# Patient Record
Sex: Male | Born: 2005 | Race: Asian | Hispanic: No | Marital: Single | Smoking: Never smoker
Health system: Southern US, Community
[De-identification: ages and names within clinical notes are randomized; demographics above are authoritative.]

## PROBLEM LIST (undated history)

## (undated) DIAGNOSIS — J45909 Unspecified asthma, uncomplicated: Secondary | ICD-10-CM

---
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
## 2025-05-08 ENCOUNTER — Encounter: Payer: Self-pay | Admitting: Family Medicine
